# Patient Record
Sex: Female | Born: 1960 | Race: White | Hispanic: No | Marital: Single | Smoking: Never smoker
Health system: Southern US, Community
[De-identification: ages and names within clinical notes are randomized; demographics above are authoritative.]

## PROBLEM LIST (undated history)

## (undated) DIAGNOSIS — C50919 Malignant neoplasm of unspecified site of unspecified female breast: Secondary | ICD-10-CM

## (undated) DIAGNOSIS — F32A Depression, unspecified: Secondary | ICD-10-CM

## (undated) DIAGNOSIS — E049 Nontoxic goiter, unspecified: Secondary | ICD-10-CM

## (undated) DIAGNOSIS — F419 Anxiety disorder, unspecified: Secondary | ICD-10-CM

## (undated) DIAGNOSIS — M810 Age-related osteoporosis without current pathological fracture: Secondary | ICD-10-CM

## (undated) DIAGNOSIS — F329 Major depressive disorder, single episode, unspecified: Secondary | ICD-10-CM

## (undated) DIAGNOSIS — M1990 Unspecified osteoarthritis, unspecified site: Secondary | ICD-10-CM

## (undated) DIAGNOSIS — D219 Benign neoplasm of connective and other soft tissue, unspecified: Secondary | ICD-10-CM

## (undated) HISTORY — PX: PORTACATH PLACEMENT: SHX2246

## (undated) HISTORY — PX: WISDOM TOOTH EXTRACTION: SHX21

## (undated) HISTORY — PX: BREAST SURGERY: SHX581

## (undated) HISTORY — PX: PORT-A-CATH REMOVAL: SHX5289

---

## 2015-09-27 DIAGNOSIS — C50919 Malignant neoplasm of unspecified site of unspecified female breast: Secondary | ICD-10-CM

## 2015-09-27 HISTORY — DX: Malignant neoplasm of unspecified site of unspecified female breast: C50.919

## 2015-09-27 HISTORY — PX: MASTECTOMY: SHX3

## 2017-10-27 HISTORY — PX: COLONOSCOPY: SHX174

## 2018-02-06 ENCOUNTER — Encounter (HOSPITAL_COMMUNITY): Payer: Self-pay

## 2018-02-06 NOTE — Patient Instructions (Addendum)
Your procedure is scheduled on: Tuesday, Feb 13, 2018   Surgery Time:  2:20PM-3:30PM   Report to [facility] Main  Entrance    Report to admitting at 11:50 AM   Call this number if you have problems the morning of surgery [phone]   Do not eat food:After Midnight.   Do NOT smoke after Midnight   May have liquids until 8:00 AM morning of surgery      CLEAR LIQUID DIET   Foods Allowed                                                                     Foods Excluded  Coffee and tea, regular and decaf                             liquids that you cannot  Plain Jell-O in any flavor                                             see through such as: Fruit ices (not with fruit pulp)                                     milk, soups, orange juice  Iced Popsicles                                    All solid food Carbonated beverages, regular and diet                                    Cranberry, grape and apple juices Sports drinks like Gatorade Lightly seasoned clear broth or consume(fat free) Sugar, honey syrup  Sample Menu Breakfast                                Lunch                                     Supper Cranberry juice                    Beef broth                            Chicken broth Jell-O                                     Grape juice                           Apple juice Coffee or tea  Jell-O                                      Popsicle                                                Coffee or tea                        Coffee or tea  _____________________________________________________________________     CLEAR LIQUID DIET   Foods Allowed                                                                     Foods Excluded  Coffee and tea, regular and decaf                             liquids that you cannot  Plain Jell-O in any flavor                                             see through such as: Fruit ices (not  with fruit pulp)                                     milk, soups, orange juice  Iced Popsicles                                    All solid food Carbonated beverages, regular and diet                                    Cranberry, grape and apple juices Sports drinks like Gatorade Lightly seasoned clear broth or consume(fat free) Sugar, honey syrup  Sample Menu Breakfast                                Lunch                                     Supper Cranberry juice                    Beef broth                            Chicken broth Jell-O  Grape juice                           Apple juice Coffee or tea                        Jell-O                                      Popsicle                                                Coffee or tea                        Coffee or tea   Take these medicines the morning of surgery with A SIP OF WATER: None                               You may not have any metal on your body including hair pins, jewelry, and body piercings             Do not wear make-up, lotions, powders, perfumes/cologne, or deodorant             Do not wear nail polish.  Do not shave  48 hours prior to surgery.               Do not bring valuables to the hospital. Jessica Mccann.   Contacts, dentures or bridgework may not be worn into surgery.   Leave suitcase in the car. After surgery it may be brought to your room.   Special Instructions: Bring a copy of your healthcare power of attorney and living will documents         the day of surgery if you haven't scanned them in before.              Please read over the following fact sheets you were given:  [facility] - Preparing for Surgery Before surgery, you can play an important role.  Because skin is not sterile, your skin needs to be as free of germs as possible.  You can reduce the number of germs on your skin by washing with CHG (chlorahexidine  gluconate) soap before surgery.  CHG is an antiseptic cleaner which kills germs and bonds with the skin to continue killing germs even after washing. Please DO NOT use if you have an allergy to CHG or antibacterial soaps.  If your skin becomes reddened/irritated stop using the CHG and inform your nurse when you arrive at Short Stay. Do not shave (including legs and underarms) for at least 48 hours prior to the first CHG shower.  You may shave your face/neck.  Please follow these instructions carefully:  1.  Shower with CHG Soap the night before surgery and the  morning of surgery.  2.  If you choose to wash your hair, wash your hair first as usual with your normal  shampoo.  3.  After you shampoo, rinse your hair and body thoroughly to remove the  shampoo.                             4.  Use CHG as you would any other liquid soap.  You can apply chg directly to the skin and wash.  Gently with a scrungie or clean washcloth.  5.  Apply the CHG Soap to your body ONLY FROM THE NECK DOWN.   Do   not use on face/ open                           Wound or open sores. Avoid contact with eyes, ears mouth and   genitals (private parts).                       Wash face,  Genitals (private parts) with your normal soap.             6.  Wash thoroughly, paying special attention to the area where your    surgery  will be performed.  7.  Thoroughly rinse your body with warm water from the neck down.  8.  DO NOT shower/wash with your normal soap after using and rinsing off the CHG Soap.                9.  Pat yourself dry with a clean towel.            10.  Wear clean pajamas.            11.  Place clean sheets on your bed the night of your first shower and do not  sleep with pets. Day of Surgery : Do not apply any lotions/deodorants the morning of surgery.  Please wear clean clothes to the hospital/surgery center.  FAILURE TO FOLLOW THESE INSTRUCTIONS MAY RESULT IN THE CANCELLATION OF YOUR SURGERY  PATIENT  SIGNATURE_________________________________  NURSE SIGNATURE__________________________________  ________________________________________________________________________   Jessica Mccann  An incentive spirometer is a tool that can help keep your lungs clear and active. This tool measures how well you are filling your lungs with each breath. Taking long deep breaths may help reverse or decrease the chance of developing breathing (pulmonary) problems (especially infection) following:  A long period of time when you are unable to move or be active. BEFORE THE PROCEDURE   If the spirometer includes an indicator to show your best effort, your nurse or respiratory therapist will set it to a desired goal.  If possible, sit up straight or lean slightly forward. Try not to slouch.  Hold the incentive spirometer in an upright position. INSTRUCTIONS FOR USE  1. Sit on the edge of your bed if possible, or sit up as far as you can in bed or on a chair. 2. Hold the incentive spirometer in an upright position. 3. Breathe out normally. 4. Place the mouthpiece in your mouth and seal your lips tightly around it. 5. Breathe in slowly and as deeply as possible, raising the piston or the ball toward the top of the column. 6. Hold your breath for 3-5 seconds or for as long as possible. Allow the piston or ball to fall to the bottom of the column. 7. Remove the mouthpiece from your mouth and breathe out normally. 8. Rest for a few seconds and repeat Steps 1 through 7 at least 10 times every 1-2 hours when you are awake. Take your time and take a few  normal breaths between deep breaths. 9. The spirometer may include an indicator to show your best effort. Use the indicator as a goal to work toward during each repetition. 10. After each set of 10 deep breaths, practice coughing to be sure your lungs are clear. If you have an incision (the cut made at the time of surgery), support your incision when coughing  by placing a pillow or rolled up towels firmly against it. Once you are able to get out of bed, walk around indoors and cough well. You may stop using the incentive spirometer when instructed by your caregiver.  RISKS AND COMPLICATIONS  Take your time so you do not get dizzy or light-headed.  If you are in pain, you may need to take or ask for pain medication before doing incentive spirometry. It is harder to take a deep breath if you are having pain. AFTER USE  Rest and breathe slowly and easily.  It can be helpful to keep track of a log of your progress. Your caregiver can provide you with a simple table to help with this. If you are using the spirometer at home, follow these instructions: [place] IF:   You are having difficultly using the spirometer.  You have trouble using the spirometer as often as instructed.  Your pain medication is not giving enough relief while using the spirometer.  You develop fever of 100.5 F (38.1 C) or higher. SEEK IMMEDIATE MEDICAL CARE IF:   You cough up bloody sputum that had not been present before.  You develop fever of 102 F (38.9 C) or greater.  You develop worsening pain at or near the incision site. MAKE SURE YOU:   Understand these instructions.  Will watch your condition.  Will get help right away if you are not doing well or get worse. Document Released: 01/23/2007 Document Revised: 12/05/2011 Document Reviewed: 03/26/2007 Exit[employer] Patient Information 2014 Exit[employer], [place].   ________________________________________________________________________  WHAT IS A BLOOD TRANSFUSION? Blood Transfusion Information  A transfusion is the replacement of blood or some of its parts. Blood is made up of multiple cells which provide different functions.  Red blood cells carry oxygen and are used for blood loss replacement.  White blood cells fight against infection.  Platelets control bleeding.  Plasma helps clot  blood.  Other blood products are available for specialized needs, such as hemophilia or other clotting disorders. BEFORE THE TRANSFUSION  Who gives blood for transfusions?   Healthy volunteers who are fully evaluated to make sure their blood is safe. This is blood bank blood. Transfusion therapy is the safest it has ever been in the practice of medicine. Before blood is taken from a donor, a complete history is taken to make sure that person has no history of diseases nor engages in risky social behavior (examples are intravenous drug use or sexual activity with multiple partners). The donor's travel history is screened to minimize risk of transmitting infections, such as malaria. The donated blood is tested for signs of infectious diseases, such as HIV and hepatitis. The blood is then tested to be sure it is compatible with you in order to minimize the chance of a transfusion reaction. If you or a relative donates blood, this is often done in anticipation of surgery and is not appropriate for emergency situations. It takes many days to process the donated blood. RISKS AND COMPLICATIONS Although transfusion therapy is very safe and saves many lives, the main dangers of transfusion include:   Getting  an infectious disease.  Developing a transfusion reaction. This is an allergic reaction to something in the blood you were given. Every precaution is taken to prevent this. The decision to have a blood transfusion has been considered carefully by your caregiver before blood is given. Blood is not given unless the benefits outweigh the risks. AFTER THE TRANSFUSION  Right after receiving a blood transfusion, you will usually feel much better and more energetic. This is especially true if your red blood cells have gotten low (anemic). The transfusion raises the level of the red blood cells which carry oxygen, and this usually causes an energy increase.  The nurse administering the transfusion will monitor  you carefully for complications. HOME CARE INSTRUCTIONS  No special instructions are needed after a transfusion. You may find your energy is better. Speak with your caregiver about any limitations on activity for underlying diseases you may have. SEEK MEDICAL CARE IF:   Your condition is not improving after your transfusion.  You develop redness or irritation at the intravenous (IV) site. SEEK IMMEDIATE MEDICAL CARE IF:  Any of the following symptoms occur over the next 12 hours:  Shaking chills.  You have a temperature by mouth above 102 F (38.9 C), not controlled by medicine.  Chest, back, or muscle pain.  People around you feel you are not acting correctly or are confused.  Shortness of breath or difficulty breathing.  Dizziness and fainting.  You get a rash or develop hives.  You have a decrease in urine output.  Your urine turns a dark color or changes to pink, red, or brown. Any of the following symptoms occur over the next 10 days:  You have a temperature by mouth above 102 F (38.9 C), not controlled by medicine.  Shortness of breath.  Weakness after normal activity.  The white part of the eye turns yellow (jaundice).  You have a decrease in the amount of urine or are urinating less often.  Your urine turns a dark color or changes to pink, red, or brown. Document Released: 09/09/2000 Document Revised: 12/05/2011 Document Reviewed: 04/28/2008 [facility] Patient Information 2014 [place], [place].  _______________________________________________________________________

## 2018-02-07 ENCOUNTER — Encounter (HOSPITAL_COMMUNITY): Payer: Self-pay

## 2018-02-07 ENCOUNTER — Encounter (HOSPITAL_COMMUNITY)
Admission: RE | Admit: 2018-02-07 | Discharge: 2018-02-07 | Disposition: A | Discharge status: Home / Self Care | Payer: BC Managed Care – PPO | Source: Ambulatory Visit | Attending: Orthopedic Surgery | Admitting: Orthopedic Surgery

## 2018-02-07 ENCOUNTER — Other Ambulatory Visit: Payer: Self-pay

## 2018-02-07 DIAGNOSIS — Z01812 Encounter for preprocedural laboratory examination: Secondary | ICD-10-CM | POA: Insufficient documentation

## 2018-02-07 HISTORY — DX: Nontoxic goiter, unspecified: E04.9

## 2018-02-07 HISTORY — DX: Unspecified osteoarthritis, unspecified site: M19.90

## 2018-02-07 HISTORY — DX: Benign neoplasm of connective and other soft tissue, unspecified: D21.9

## 2018-02-07 HISTORY — DX: Age-related osteoporosis without current pathological fracture: M81.0

## 2018-02-07 HISTORY — DX: Major depressive disorder, single episode, unspecified: F32.9

## 2018-02-07 HISTORY — DX: Anxiety disorder, unspecified: F41.9

## 2018-02-07 HISTORY — DX: Malignant neoplasm of unspecified site of unspecified female breast: C50.919

## 2018-02-07 HISTORY — DX: Depression, unspecified: F32.A

## 2018-02-07 LAB — CBC
HEMATOCRIT: 40.1 % (ref 36.0–46.0)
Hemoglobin: 13.2 g/dL (ref 12.0–15.0)
MCH: 30.8 pg (ref 26.0–34.0)
MCHC: 32.9 g/dL (ref 30.0–36.0)
MCV: 93.5 fL (ref 78.0–100.0)
Platelets: 304 10*3/uL (ref 150–400)
RBC: 4.29 MIL/uL (ref 3.87–5.11)
RDW: 12.5 % (ref 11.5–15.5)
WBC: 5.1 10*3/uL (ref 4.0–10.5)

## 2018-02-07 LAB — SURGICAL PCR SCREEN
MRSA, PCR: NEGATIVE
Staphylococcus aureus: NEGATIVE

## 2018-02-08 LAB — ABO/RH: ABO/RH(D): A POS

## 2018-02-11 NOTE — H&P (Signed)
TOTAL HIP ADMISSION H&P  Patient is admitted for right total hip arthroplasty, anterior approach.  Subjective:  Chief Complaint: Right hip primary OA / pain  HPI: Jessica Mccann, 57 y.o. female, has a history of pain and functional disability in the right hip(s) due to arthritis and patient has failed non-surgical conservative treatments for greater than 12 weeks to include NSAID's and/or analgesics and activity modification.  Onset of symptoms was gradual starting 5+ years ago with gradually worsening course since that time.The patient noted no past surgery on the right hip(s).  Patient currently rates pain in the right hip at 4 out of 10 with activity. Patient has night pain, worsening of pain with activity and weight bearing, trendelenberg gait, pain that interfers with activities of daily living and pain with passive range of motion. Patient has evidence of periarticular osteophytes and joint space narrowing by imaging studies. This condition presents safety issues increasing the risk of falls.  There is no current active infection.  Risks, benefits and expectations were discussed with the patient.  Risks including but not limited to the risk of anesthesia, blood clots, nerve damage, blood vessel damage, failure of the prosthesis, infection and up to and including death.  Patient understand the risks, benefits and expectations and wishes to proceed with surgery.   PCP: [facility] [place]  D/C Plans:       Home   Post-op Meds:       No Rx given   Tranexamic Acid:      To be given - IV   Decadron:      Is to be given  FYI:      ASA  Norco  DME:    Rx given for - RW   PT:  No PT     Past Medical History:  Diagnosis Date  . Anxiety   . Arthritis    hip and toe  . Breast cancer ([place]) 2017   Right  . Depression   . Fibroids   . Goiter   . Osteoporosis     Past Surgical History:  Procedure Laterality Date  . BREAST SURGERY    . COLONOSCOPY  10/2017  .  MASTECTOMY Right 2017  . PORT-A-CATH REMOVAL    . PORTACATH PLACEMENT    . WISDOM TOOTH EXTRACTION      No current facility-administered medications for this encounter.    Current Outpatient Medications  Medication Sig Dispense Refill Last Dose  . Calcium Carb-Cholecalciferol (CALCIUM 500+D3 PO) Take 1 tablet by mouth 2 (two) times daily.     . cholecalciferol (VITAMIN D) 1000 units tablet Take 1,000 Units by mouth daily.     Marland Kitchen denosumab (PROLIA) 60 MG/ML SOSY injection Inject 60 mg into the skin every 6 (six) months.     . DULoxetine (CYMBALTA) 30 MG capsule Take 1 capsule by mouth daily.  5   . letrozole (FEMARA) 2.5 MG tablet Take 1 tablet by mouth daily.  0   . Multiple Vitamins-Minerals (MULTIVITAMIN WITH MINERALS) tablet Take 1 tablet by mouth daily.      No Known Allergies   Social History   Tobacco Use  . Smoking status: Never Smoker  . Smokeless tobacco: Never Used  Substance Use Topics  . Alcohol use: Not Currently       Review of Systems  Constitutional: Negative.   HENT: Negative.   Eyes: Negative.   Respiratory: Negative.   Cardiovascular: Negative.   Gastrointestinal: Negative.   Genitourinary: Negative.  Musculoskeletal: Positive for joint pain.  Skin: Negative.   Neurological: Negative.   Endo/Heme/Allergies: Negative.   Psychiatric/Behavioral: Positive for depression. The patient is nervous/anxious.     Objective:  Physical Exam  Constitutional: She is oriented to person, place, and time. She appears well-developed.  HENT:  Head: Normocephalic.  Eyes: Pupils are equal, round, and reactive to light.  Neck: Neck supple. No JVD present. No tracheal deviation present. No thyromegaly present.  Cardiovascular: Normal rate, regular rhythm and intact distal pulses.  Respiratory: Effort normal and breath sounds normal. No respiratory distress. She has no wheezes.  GI: Soft. There is no tenderness. There is no guarding.  Musculoskeletal:       Right hip:  She exhibits decreased range of motion, decreased strength, tenderness and bony tenderness. She exhibits no swelling, no deformity and no laceration.  Lymphadenopathy:    She has no cervical adenopathy.  Neurological: She is alert and oriented to person, place, and time.  Skin: Skin is warm and dry.  Psychiatric: She has a normal mood and affect.      Labs:  Estimated body mass index is 21.73 kg/m as calculated from the following:   Height as of 02/07/18: 5' 8.5" (1.74 m).   Weight as of 02/07/18: 65.8 kg (145 lb).   Imaging Review Plain radiographs demonstrate severe degenerative joint disease of the right hip(s). The bone quality appears to be good for age and reported activity level.    Preoperative templating of the joint replacement has been completed, documented, and submitted to the Operating Room personnel in order to optimize intra-operative equipment management.     Assessment/Plan:  End stage arthritis, right hip(s)  The patient history, physical examination, clinical judgement of the provider and imaging studies are consistent with end stage degenerative joint disease of the right hip(s) and total hip arthroplasty is deemed medically necessary. The treatment options including medical management, injection therapy, arthroscopy and arthroplasty were discussed at length. The risks and benefits of total hip arthroplasty were presented and reviewed. The risks due to aseptic loosening, infection, stiffness, dislocation/subluxation,  thromboembolic complications and other imponderables were discussed.  The patient acknowledged the explanation, agreed to proceed with the plan and consent was signed. Patient is being admitted for inpatient treatment for surgery, pain control, PT, OT, prophylactic antibiotics, VTE prophylaxis, progressive ambulation and ADL's and discharge planning.The patient is planning to be discharged home.     Jessica Mccann Paxson Harrower   PA-C  02/11/2018, 1:21 PM

## 2018-02-12 MED ORDER — TRANEXAMIC ACID 1000 MG/10ML IV SOLN
1000.0000 mg | INTRAVENOUS | Status: AC
  Administered 2018-02-13: 1000 mg via INTRAVENOUS
  Filled 2018-02-12: qty 1100

## 2018-02-13 ENCOUNTER — Inpatient Hospital Stay (HOSPITAL_COMMUNITY): Payer: BC Managed Care – PPO

## 2018-02-13 ENCOUNTER — Inpatient Hospital Stay (HOSPITAL_COMMUNITY)
Admission: RE | Admit: 2018-02-13 | Discharge: 2018-02-14 | DRG: 470 | Disposition: A | Discharge status: Home / Self Care | Payer: BC Managed Care – PPO | Source: Ambulatory Visit | Attending: Orthopedic Surgery | Admitting: Orthopedic Surgery

## 2018-02-13 ENCOUNTER — Inpatient Hospital Stay (HOSPITAL_COMMUNITY): Payer: BC Managed Care – PPO | Admitting: Certified Registered Nurse Anesthetist

## 2018-02-13 ENCOUNTER — Encounter (HOSPITAL_COMMUNITY)
Admission: RE | Disposition: A | Discharge status: Home / Self Care | Payer: Self-pay | Source: Ambulatory Visit | Attending: Orthopedic Surgery

## 2018-02-13 ENCOUNTER — Encounter (HOSPITAL_COMMUNITY): Payer: Self-pay | Admitting: Certified Registered Nurse Anesthetist

## 2018-02-13 DIAGNOSIS — M1631 Unilateral osteoarthritis resulting from hip dysplasia, right hip: Principal | ICD-10-CM | POA: Diagnosis present

## 2018-02-13 DIAGNOSIS — F329 Major depressive disorder, single episode, unspecified: Secondary | ICD-10-CM | POA: Diagnosis present

## 2018-02-13 DIAGNOSIS — Z96641 Presence of right artificial hip joint: Secondary | ICD-10-CM

## 2018-02-13 DIAGNOSIS — Z7982 Long term (current) use of aspirin: Secondary | ICD-10-CM | POA: Diagnosis not present

## 2018-02-13 DIAGNOSIS — Z853 Personal history of malignant neoplasm of breast: Secondary | ICD-10-CM

## 2018-02-13 DIAGNOSIS — Z96649 Presence of unspecified artificial hip joint: Secondary | ICD-10-CM

## 2018-02-13 DIAGNOSIS — M810 Age-related osteoporosis without current pathological fracture: Secondary | ICD-10-CM | POA: Diagnosis present

## 2018-02-13 DIAGNOSIS — M25551 Pain in right hip: Secondary | ICD-10-CM | POA: Diagnosis present

## 2018-02-13 DIAGNOSIS — Z9011 Acquired absence of right breast and nipple: Secondary | ICD-10-CM

## 2018-02-13 DIAGNOSIS — F419 Anxiety disorder, unspecified: Secondary | ICD-10-CM | POA: Diagnosis present

## 2018-02-13 DIAGNOSIS — Z79811 Long term (current) use of aromatase inhibitors: Secondary | ICD-10-CM | POA: Diagnosis not present

## 2018-02-13 HISTORY — PX: TOTAL HIP ARTHROPLASTY: SHX124

## 2018-02-13 LAB — TYPE AND SCREEN
ABO/RH(D): A POS
Antibody Screen: NEGATIVE

## 2018-02-13 SURGERY — ARTHROPLASTY, HIP, TOTAL, ANTERIOR APPROACH
Anesthesia: Spinal | Site: Hip | Laterality: Right

## 2018-02-13 MED ORDER — PROPOFOL 500 MG/50ML IV EMUL
INTRAVENOUS | Status: DC | PRN
  Administered 2018-02-13: 100 ug/kg/min via INTRAVENOUS

## 2018-02-13 MED ORDER — METHOCARBAMOL 1000 MG/10ML IJ SOLN
500.0000 mg | Freq: Four times a day (QID) | INTRAMUSCULAR | Status: DC | PRN
Start: 2018-02-13 — End: 2018-02-14
  Administered 2018-02-13: 500 mg via INTRAVENOUS
  Filled 2018-02-13: qty 550

## 2018-02-13 MED ORDER — METOCLOPRAMIDE HCL 5 MG PO TABS: 5.0000 mg | ORAL_TABLET | Freq: Three times a day (TID) | ORAL | Status: DC | PRN

## 2018-02-13 MED ORDER — ALUM & MAG HYDROXIDE-SIMETH 200-200-20 MG/5ML PO SUSP: 15.0000 mL | ORAL | Status: DC | PRN

## 2018-02-13 MED ORDER — ACETAMINOPHEN 325 MG PO TABS: 325.0000 mg | ORAL_TABLET | Freq: Four times a day (QID) | ORAL | Status: DC | PRN

## 2018-02-13 MED ORDER — STERILE WATER FOR IRRIGATION IR SOLN
Status: DC | PRN
  Administered 2018-02-13: 2000 mL

## 2018-02-13 MED ORDER — FERROUS SULFATE 325 (65 FE) MG PO TABS
325.0000 mg | ORAL_TABLET | Freq: Three times a day (TID) | ORAL | Status: DC
  Administered 2018-02-13 – 2018-02-14 (×3): 325 mg via ORAL
  Filled 2018-02-13 (×3): qty 1

## 2018-02-13 MED ORDER — METHOCARBAMOL 500 MG PO TABS: 500.0000 mg | ORAL_TABLET | Freq: Four times a day (QID) | ORAL | 0 refills | Status: AC | PRN

## 2018-02-13 MED ORDER — ONDANSETRON HCL 4 MG/2ML IJ SOLN
INTRAMUSCULAR | Status: DC | PRN
  Administered 2018-02-13: 4 mg via INTRAVENOUS

## 2018-02-13 MED ORDER — FENTANYL CITRATE (PF) 100 MCG/2ML IJ SOLN
INTRAMUSCULAR | Status: DC | PRN
  Administered 2018-02-13: 100 ug via INTRAVENOUS

## 2018-02-13 MED ORDER — PROPOFOL 10 MG/ML IV BOLUS
INTRAVENOUS | Status: AC
  Filled 2018-02-13: qty 20

## 2018-02-13 MED ORDER — DOCUSATE SODIUM 100 MG PO CAPS
100.0000 mg | ORAL_CAPSULE | Freq: Two times a day (BID) | ORAL | Status: DC
  Administered 2018-02-13 – 2018-02-14 (×2): 100 mg via ORAL
  Filled 2018-02-13 (×2): qty 1

## 2018-02-13 MED ORDER — ASPIRIN 81 MG PO CHEW
81.0000 mg | CHEWABLE_TABLET | Freq: Two times a day (BID) | ORAL | Status: DC
  Administered 2018-02-13 – 2018-02-14 (×2): 81 mg via ORAL
  Filled 2018-02-13 (×2): qty 1

## 2018-02-13 MED ORDER — FENTANYL CITRATE (PF) 100 MCG/2ML IJ SOLN
INTRAMUSCULAR | Status: AC
  Filled 2018-02-13: qty 2

## 2018-02-13 MED ORDER — GLYCOPYRROLATE 0.2 MG/ML IV SOSY
PREFILLED_SYRINGE | INTRAVENOUS | Status: AC
  Filled 2018-02-13: qty 5

## 2018-02-13 MED ORDER — DEXAMETHASONE SODIUM PHOSPHATE 10 MG/ML IJ SOLN
10.0000 mg | Freq: Once | INTRAMUSCULAR | Status: AC
  Administered 2018-02-14: 10 mg via INTRAVENOUS
  Filled 2018-02-13: qty 1

## 2018-02-13 MED ORDER — ONDANSETRON HCL 4 MG PO TABS: 4.0000 mg | ORAL_TABLET | Freq: Four times a day (QID) | ORAL | Status: DC | PRN

## 2018-02-13 MED ORDER — TRANEXAMIC ACID 1000 MG/10ML IV SOLN
1000.0000 mg | Freq: Once | INTRAVENOUS | Status: AC
  Administered 2018-02-13: 1000 mg via INTRAVENOUS
  Filled 2018-02-13: qty 10

## 2018-02-13 MED ORDER — ONDANSETRON HCL 4 MG/2ML IJ SOLN: 4.0000 mg | Freq: Once | INTRAMUSCULAR | Status: DC | PRN

## 2018-02-13 MED ORDER — LIDOCAINE HCL (CARDIAC) PF 100 MG/5ML IV SOSY
PREFILLED_SYRINGE | INTRAVENOUS | Status: DC | PRN
  Administered 2018-02-13: 60 mg via INTRAVENOUS

## 2018-02-13 MED ORDER — DOCUSATE SODIUM 100 MG PO CAPS: 100.0000 mg | ORAL_CAPSULE | Freq: Two times a day (BID) | ORAL | 0 refills | Status: AC

## 2018-02-13 MED ORDER — MENTHOL 3 MG MT LOZG: 1.0000 | LOZENGE | OROMUCOSAL | Status: DC | PRN

## 2018-02-13 MED ORDER — DEXAMETHASONE SODIUM PHOSPHATE 10 MG/ML IJ SOLN
10.0000 mg | Freq: Once | INTRAMUSCULAR | Status: AC
  Administered 2018-02-13: 10 mg via INTRAVENOUS

## 2018-02-13 MED ORDER — DULOXETINE HCL 30 MG PO CPEP
30.0000 mg | ORAL_CAPSULE | Freq: Every day | ORAL | Status: DC
  Administered 2018-02-13: 30 mg via ORAL
  Filled 2018-02-13 (×2): qty 1

## 2018-02-13 MED ORDER — DEXAMETHASONE SODIUM PHOSPHATE 10 MG/ML IJ SOLN
INTRAMUSCULAR | Status: AC
  Filled 2018-02-13: qty 1

## 2018-02-13 MED ORDER — LACTATED RINGERS IV SOLN
INTRAVENOUS | Status: DC
  Administered 2018-02-13 (×2): via INTRAVENOUS

## 2018-02-13 MED ORDER — MAGNESIUM CITRATE PO SOLN: 1.0000 | Freq: Once | ORAL | Status: DC | PRN

## 2018-02-13 MED ORDER — PHENYLEPHRINE 40 MCG/ML (10ML) SYRINGE FOR IV PUSH (FOR BLOOD PRESSURE SUPPORT)
PREFILLED_SYRINGE | INTRAVENOUS | Status: DC | PRN
  Administered 2018-02-13 (×9): 80 ug via INTRAVENOUS

## 2018-02-13 MED ORDER — FERROUS SULFATE 325 (65 FE) MG PO TABS: 325.0000 mg | ORAL_TABLET | Freq: Three times a day (TID) | ORAL | 3 refills | Status: AC

## 2018-02-13 MED ORDER — SODIUM CHLORIDE 0.9 % IR SOLN
Status: DC | PRN
  Administered 2018-02-13: 1000 mL

## 2018-02-13 MED ORDER — PROPOFOL 10 MG/ML IV BOLUS
INTRAVENOUS | Status: DC | PRN
  Administered 2018-02-13 (×2): 20 mg via INTRAVENOUS

## 2018-02-13 MED ORDER — 0.9 % SODIUM CHLORIDE (POUR BTL) OPTIME
TOPICAL | Status: DC | PRN
  Administered 2018-02-13: 1000 mL

## 2018-02-13 MED ORDER — EPHEDRINE SULFATE-NACL 50-0.9 MG/10ML-% IV SOSY
PREFILLED_SYRINGE | INTRAVENOUS | Status: DC | PRN
  Administered 2018-02-13 (×2): 10 mg via INTRAVENOUS

## 2018-02-13 MED ORDER — POLYETHYLENE GLYCOL 3350 17 G PO PACK: 17.0000 g | PACK | Freq: Two times a day (BID) | ORAL | 0 refills | Status: AC

## 2018-02-13 MED ORDER — HYDROCODONE-ACETAMINOPHEN 7.5-325 MG PO TABS: 1.0000 | ORAL_TABLET | ORAL | 0 refills | Status: AC | PRN

## 2018-02-13 MED ORDER — LIDOCAINE 2% (20 MG/ML) 5 ML SYRINGE
INTRAMUSCULAR | Status: AC
  Filled 2018-02-13: qty 5

## 2018-02-13 MED ORDER — CELECOXIB 200 MG PO CAPS
200.0000 mg | ORAL_CAPSULE | Freq: Two times a day (BID) | ORAL | Status: DC
  Administered 2018-02-13 – 2018-02-14 (×2): 200 mg via ORAL
  Filled 2018-02-13 (×2): qty 1

## 2018-02-13 MED ORDER — HYDROMORPHONE HCL 1 MG/ML IJ SOLN: 0.5000 mg | INTRAMUSCULAR | Status: DC | PRN

## 2018-02-13 MED ORDER — HYDROCODONE-ACETAMINOPHEN 5-325 MG PO TABS
1.0000 | ORAL_TABLET | ORAL | Status: DC | PRN
  Administered 2018-02-13 – 2018-02-14 (×3): 1 via ORAL
  Filled 2018-02-13: qty 1
  Filled 2018-02-13: qty 2
  Filled 2018-02-13: qty 1

## 2018-02-13 MED ORDER — LETROZOLE 2.5 MG PO TABS
2.5000 mg | ORAL_TABLET | Freq: Every day | ORAL | Status: DC
  Administered 2018-02-13: 2.5 mg via ORAL
  Filled 2018-02-13 (×2): qty 1

## 2018-02-13 MED ORDER — BISACODYL 10 MG RE SUPP: 10.0000 mg | Freq: Every day | RECTAL | Status: DC | PRN

## 2018-02-13 MED ORDER — ASPIRIN 81 MG PO CHEW: 81.0000 mg | CHEWABLE_TABLET | Freq: Two times a day (BID) | ORAL | 0 refills | Status: AC

## 2018-02-13 MED ORDER — BUPIVACAINE HCL (PF) 0.5 % IJ SOLN
INTRAMUSCULAR | Status: AC
  Filled 2018-02-13: qty 30

## 2018-02-13 MED ORDER — ONDANSETRON HCL 4 MG/2ML IJ SOLN
INTRAMUSCULAR | Status: AC
  Filled 2018-02-13: qty 2

## 2018-02-13 MED ORDER — PROPOFOL 10 MG/ML IV BOLUS
INTRAVENOUS | Status: AC
  Filled 2018-02-13: qty 40

## 2018-02-13 MED ORDER — SODIUM CHLORIDE 0.9 % IV SOLN
INTRAVENOUS | Status: DC
  Administered 2018-02-13 (×2): via INTRAVENOUS

## 2018-02-13 MED ORDER — PHENOL 1.4 % MT LIQD: 1.0000 | OROMUCOSAL | Status: DC | PRN

## 2018-02-13 MED ORDER — MEPERIDINE HCL 50 MG/ML IJ SOLN: 6.2500 mg | INTRAMUSCULAR | Status: DC | PRN

## 2018-02-13 MED ORDER — DIPHENHYDRAMINE HCL 12.5 MG/5ML PO ELIX: 12.5000 mg | ORAL_SOLUTION | ORAL | Status: DC | PRN

## 2018-02-13 MED ORDER — ONDANSETRON HCL 4 MG/2ML IJ SOLN: 4.0000 mg | Freq: Four times a day (QID) | INTRAMUSCULAR | Status: DC | PRN

## 2018-02-13 MED ORDER — METOCLOPRAMIDE HCL 5 MG/ML IJ SOLN: 5.0000 mg | Freq: Three times a day (TID) | INTRAMUSCULAR | Status: DC | PRN

## 2018-02-13 MED ORDER — HYDROMORPHONE HCL 1 MG/ML IJ SOLN: 0.2500 mg | INTRAMUSCULAR | Status: DC | PRN

## 2018-02-13 MED ORDER — POLYETHYLENE GLYCOL 3350 17 G PO PACK
17.0000 g | PACK | Freq: Two times a day (BID) | ORAL | Status: DC
  Administered 2018-02-14: 17 g via ORAL
  Filled 2018-02-13: qty 1

## 2018-02-13 MED ORDER — MIDAZOLAM HCL 5 MG/5ML IJ SOLN
INTRAMUSCULAR | Status: DC | PRN
  Administered 2018-02-13: 2 mg via INTRAVENOUS

## 2018-02-13 MED ORDER — CEFAZOLIN SODIUM-DEXTROSE 2-4 GM/100ML-% IV SOLN
2.0000 g | Freq: Four times a day (QID) | INTRAVENOUS | Status: AC
  Administered 2018-02-13 – 2018-02-14 (×2): 2 g via INTRAVENOUS
  Filled 2018-02-13 (×2): qty 100

## 2018-02-13 MED ORDER — CHLORHEXIDINE GLUCONATE 4 % EX LIQD: 60.0000 mL | Freq: Once | CUTANEOUS | Status: DC

## 2018-02-13 MED ORDER — HYDROCODONE-ACETAMINOPHEN 7.5-325 MG PO TABS
1.0000 | ORAL_TABLET | ORAL | Status: DC | PRN
  Filled 2018-02-13: qty 1

## 2018-02-13 MED ORDER — METHOCARBAMOL 500 MG PO TABS: 500.0000 mg | ORAL_TABLET | Freq: Four times a day (QID) | ORAL | Status: DC | PRN

## 2018-02-13 MED ORDER — MIDAZOLAM HCL 2 MG/2ML IJ SOLN
INTRAMUSCULAR | Status: AC
  Filled 2018-02-13: qty 2

## 2018-02-13 MED ORDER — CEFAZOLIN SODIUM-DEXTROSE 2-4 GM/100ML-% IV SOLN
2.0000 g | INTRAVENOUS | Status: AC
  Administered 2018-02-13: 2 g via INTRAVENOUS
  Filled 2018-02-13: qty 100

## 2018-02-13 SURGICAL SUPPLY — 35 items
BAG DECANTER FOR FLEXI CONT (MISCELLANEOUS) IMPLANT
BAG ZIPLOCK 12X15 (MISCELLANEOUS) IMPLANT
BLADE SAG 18X100X1.27 (BLADE) ×3 IMPLANT
CAPT HIP TOTAL 2 ×3 IMPLANT
COVER PERINEAL POST (MISCELLANEOUS) ×3 IMPLANT
COVER SURGICAL LIGHT HANDLE (MISCELLANEOUS) ×3 IMPLANT
DERMABOND ADVANCED (GAUZE/BANDAGES/DRESSINGS) ×2
DERMABOND ADVANCED .7 DNX12 (GAUZE/BANDAGES/DRESSINGS) ×1 IMPLANT
DRAPE STERI IOBAN 125X83 (DRAPES) ×3 IMPLANT
DRAPE U-SHAPE 47X51 STRL (DRAPES) ×6 IMPLANT
DRESSING AQUACEL AG SP 3.5X10 (GAUZE/BANDAGES/DRESSINGS) ×1 IMPLANT
DRSG AQUACEL AG ADV 3.5X10 (GAUZE/BANDAGES/DRESSINGS) ×3 IMPLANT
DRSG AQUACEL AG SP 3.5X10 (GAUZE/BANDAGES/DRESSINGS) ×3
DURAPREP 26ML APPLICATOR (WOUND CARE) ×3 IMPLANT
ELECT REM PT RETURN 15FT ADLT (MISCELLANEOUS) ×3 IMPLANT
GLOVE BIOGEL M STRL SZ7.5 (GLOVE) IMPLANT
GLOVE BIOGEL PI IND STRL 7.5 (GLOVE) ×1 IMPLANT
GLOVE BIOGEL PI IND STRL 8.5 (GLOVE) ×1 IMPLANT
GLOVE BIOGEL PI INDICATOR 7.5 (GLOVE) ×2
GLOVE BIOGEL PI INDICATOR 8.5 (GLOVE) ×2
GLOVE ECLIPSE 8.0 STRL XLNG CF (GLOVE) ×6 IMPLANT
GLOVE ORTHO TXT STRL SZ7.5 (GLOVE) ×3 IMPLANT
GOWN STRL REUS W/TWL 2XL LVL3 (GOWN DISPOSABLE) ×3 IMPLANT
GOWN STRL REUS W/TWL LRG LVL3 (GOWN DISPOSABLE) ×3 IMPLANT
HOLDER FOLEY CATH W/STRAP (MISCELLANEOUS) ×3 IMPLANT
PACK ANTERIOR HIP CUSTOM (KITS) ×3 IMPLANT
SUT MNCRL AB 4-0 PS2 18 (SUTURE) ×3 IMPLANT
SUT STRATAFIX 0 PDS 27 VIOLET (SUTURE) ×3
SUT VIC AB 1 CT1 36 (SUTURE) ×9 IMPLANT
SUT VIC AB 2-0 CT1 27 (SUTURE) ×4
SUT VIC AB 2-0 CT1 TAPERPNT 27 (SUTURE) ×2 IMPLANT
SUTURE STRATFX 0 PDS 27 VIOLET (SUTURE) ×1 IMPLANT
TRAY FOLEY CATH 14FR (SET/KITS/TRAYS/PACK) ×3 IMPLANT
WATER STERILE IRR 1000ML POUR (IV SOLUTION) ×6 IMPLANT
YANKAUER SUCT BULB TIP 10FT TU (MISCELLANEOUS) ×3 IMPLANT

## 2018-02-13 NOTE — Interval H&P Note (Signed)
History and Physical Interval Note:  02/13/2018 12:58 PM  Jessica Mccann  has presented today for surgery, with the diagnosis of right hip osteoarthritis  The various methods of treatment have been discussed with the patient and family. After consideration of risks, benefits and other options for treatment, the patient has consented to  Procedure(s): RIGHT TOTAL HIP ARTHROPLASTY ANTERIOR APPROACH (Right) as a surgical intervention .  The patient's history has been reviewed, patient examined, no change in status, stable for surgery.  I have reviewed the patient's chart and labs.  Questions were answered to the patient's satisfaction.     Mauri Pole

## 2018-02-13 NOTE — Transfer of Care (Signed)
Immediate Anesthesia Transfer of Care Note  Patient: Jessica Mccann  Procedure(s) Performed: RIGHT TOTAL HIP ARTHROPLASTY ANTERIOR APPROACH (Right Hip)  Patient Location: PACU  Anesthesia Type:Spinal  Level of Consciousness: awake, alert , oriented and patient cooperative  Airway & Oxygen Therapy: Patient Spontanous Breathing and Patient connected to face mask  Post-op Assessment: Report given to RN, Post -op Vital signs reviewed and stable and Patient moving all extremities  Post vital signs: Reviewed and stable  Last Vitals:  Vitals Value Taken Time  BP 94/41 02/13/2018  3:56 PM  Temp    Pulse 79 02/13/2018  3:56 PM  Resp 21 02/13/2018  3:58 PM  SpO2 100 % 02/13/2018  3:56 PM  Vitals shown include unvalidated device data.  Last Pain:  Vitals:   02/13/18 1322  TempSrc: Oral  PainSc:       Patients Stated Pain Goal: 1 (71/06/26 9485)  Complications: No apparent anesthesia complications

## 2018-02-13 NOTE — Op Note (Signed)
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      SURGEON:  Pietro Cassis. Alvan Dame, M.D.      ASSISTANT:  Danae Orleans, PA-C     ANESTHESIA:  Spinal.      SPECIMENS:  None.      COMPLICATIONS:  None.      BLOOD LOSS:  250 cc     DRAINS:  None.      INDICATION OF THE PROCEDURE:  Jessica Mccann is a 57 y.o. female who had   presented to office for evaluation of right hip pain.  Radiographs revealed   progressive degenerative changes with bone-on-bone   articulation to the  hip joint.  The patient had painful limited range of   motion significantly affecting their overall quality of life.  The patient was failing to    respond to conservative measures, and at this point was ready   to proceed with more definitive measures.  The patient has noted progressive   degenerative changes in his hip, progressive problems and dysfunction   with regarding the hip prior to surgery.  Consent was obtained for   benefit of pain relief.  Specific risk of infection, DVT, component   failure, dislocation, need for revision surgery, as well discussion of   the anterior versus posterior approach were reviewed.  Consent was   obtained for benefit of anterior pain relief through an anterior   approach.      PROCEDURE IN DETAIL:  The patient was brought to operative  theater.   Once adequate anesthesia, preoperative antibiotics, 2 gm of Ancef, 1 gm of Tranexamic Acid, and 10 mg of Decadron administered.   The patient was positioned supine on the OSI Hanna table.  Once adequate   padding of boney process was carried out, we had predraped out the hip, and  used fluoroscopy to confirm orientation of the pelvis and position.      The right hip was then prepped and draped from proximal iliac crest to   mid thigh with shower curtain technique.      Time-out was performed identifying the patient, planned procedure, and   extremity.     An incision was then made 2 cm distal and lateral to the   anterior superior iliac spine extending over the orientation of the   tensor fascia lata muscle and sharp dissection was carried down to the   fascia of the muscle and protractor placed in the  soft tissues.      The fascia was then incised.  The muscle belly was identified and swept   laterally and retractor placed along the superior neck.  Following   cauterization of the circumflex vessels and removing some pericapsular   fat, a second cobra retractor was placed on the inferior neck.  A third   retractor was placed on the anterior acetabulum after elevating the   anterior rectus.  A L-capsulotomy was along the line of the   superior neck to the trochanteric fossa, then extended proximally and   distally.  Tag sutures were placed and the retractors were then placed   intracapsular.  We then identified the trochanteric fossa and   orientation of my neck cut, confirmed this radiographically   and then made a neck osteotomy with the femur on traction.  The femoral   head was removed without difficulty or complication.  Traction was let   off and retractors were placed posterior and anterior around the   acetabulum.      The labrum and foveal tissue were debrided.  I began reaming with a 56mm   reamer and reamed up to 51mm reamer with good bony bed preparation and a  33mm   cup was chosen.  The final 62mm Pinnacle cup was then impacted under fluoroscopy  to confirm the depth of penetration and orientation with respect to   abduction.  A screw was placed followed by the hole eliminator.  The final   36+4 neutral Altrex liner was impacted with good visualized rim fit.  The cup was positioned anatomically within the acetabular portion of the pelvis.      At this point, the femur was rolled at 80 degrees.  Further capsule was   released off the inferior aspect of the femoral neck.  I then   released the superior capsule proximally.  The hook was placed laterally   along the femur and elevated manually and held in position with the bed   hook.  The leg was then extended and adducted with the leg rolled to 100   degrees of external rotation.  Once the proximal femur was fully   exposed, I used a box osteotome to set orientation.  I then began   broaching with the starting chili pepper broach and passed this by hand and then broached up to 2.  With the 2 broach in place I chose a high offset neck and did several trial reductions.  The offset was appropriate, leg lengths   appeared to be equal best matched with the +5 head ball confirmed radiographically.   Given these findings, I went ahead and dislocated the hip, repositioned all   retractors and positioned the right hip in the extended and abducted position.  The final 2 Hi Tri Lock stem was   chosen and it was impacted down to the level of neck cut.  Based on this   and the trial reduction, a 36+5 delta ceramic ball was chosen and   impacted onto a clean and dry trunnion, and the hip was reduced.  The   hip had been irrigated throughout the case again at this point.  I did   reapproximate the superior capsular leaflet to the anterior leaflet   using #1 Vicryl.  The fascia of the   tensor fascia lata muscle was then reapproximated using #1 Vicryl and #0 Stratafix sutures.  The   remaining wound was closed with  2-0 Vicryl and running  4-0 Monocryl.   The hip was cleaned, dried, and dressed sterilely using Dermabond and   Aquacel dressing.  She was then brought   to recovery room in stable condition tolerating the procedure well.    Danae Orleans, PA-C was present for the entirety of the case involved from   preoperative positioning, perioperative retractor management, general   facilitation of the case, as well as primary wound closure as assistant.            Pietro Cassis Alvan Dame, M.D.        02/13/2018 2:14 PM

## 2018-02-13 NOTE — Plan of Care (Signed)
Reviewed plan of care with pt and family, specifically safety precautions, pain control, and importance of notifying RN with any questions or concerns. Pt verbalized understanding of all education.

## 2018-02-13 NOTE — Discharge Instructions (Signed)

## 2018-02-13 NOTE — Anesthesia Postprocedure Evaluation (Signed)
Anesthesia Post Note  Patient: Armando Lauman  Procedure(s) Performed: RIGHT TOTAL HIP ARTHROPLASTY ANTERIOR APPROACH (Right Hip)     Patient location during evaluation: PACU Anesthesia Type: Spinal Level of consciousness: oriented and awake and alert Pain management: pain level controlled Vital Signs Assessment: post-procedure vital signs reviewed and stable Respiratory status: spontaneous breathing, respiratory function stable and patient connected to nasal cannula oxygen Cardiovascular status: blood pressure returned to baseline and stable Postop Assessment: no headache, no backache and no apparent nausea or vomiting Anesthetic complications: no    Last Vitals:  Vitals:   02/13/18 1845 02/13/18 1927  BP: (!) 120/57 (!) 109/47  Pulse: 88 65  Resp: (!) 23   Temp:    SpO2: 100% 100%    Last Pain:  Vitals:   02/13/18 1845  TempSrc:   PainSc: 0-No pain                 Jakai Risse DAVID

## 2018-02-13 NOTE — Anesthesia Preprocedure Evaluation (Signed)
Anesthesia Evaluation  Patient identified by MRN, date of birth, ID band Patient awake    Reviewed: Allergy & Precautions, NPO status , Patient's Chart, lab work & pertinent test results  Airway Mallampati: I  TM Distance: >3 FB Neck ROM: Full    Dental   Pulmonary    Pulmonary exam normal        Cardiovascular Normal cardiovascular exam     Neuro/Psych Anxiety Depression    GI/Hepatic   Endo/Other    Renal/GU      Musculoskeletal   Abdominal   Peds  Hematology   Anesthesia Other Findings   Reproductive/Obstetrics                             Anesthesia Physical Anesthesia Plan  ASA: II  Anesthesia Plan: Spinal   Post-op Pain Management:    Induction: Intravenous  PONV Risk Score and Plan: 2 and Ondansetron and Midazolam  Airway Management Planned: Simple Face Mask  Additional Equipment:   Intra-op Plan:   Post-operative Plan:   Informed Consent: I have reviewed the patients History and Physical, chart, labs and discussed the procedure including the risks, benefits and alternatives for the proposed anesthesia with the patient or authorized representative who has indicated his/her understanding and acceptance.     Plan Discussed with: CRNA and Surgeon  Anesthesia Plan Comments:         Anesthesia Quick Evaluation

## 2018-02-14 ENCOUNTER — Other Ambulatory Visit: Payer: Self-pay

## 2018-02-14 ENCOUNTER — Encounter (HOSPITAL_COMMUNITY): Payer: Self-pay | Admitting: Orthopedic Surgery

## 2018-02-14 LAB — BASIC METABOLIC PANEL
ANION GAP: 8 (ref 5–15)
BUN: 11 mg/dL (ref 6–20)
CALCIUM: 8 mg/dL — AB (ref 8.9–10.3)
CO2: 24 mmol/L (ref 22–32)
Chloride: 107 mmol/L (ref 101–111)
Creatinine, Ser: 0.6 mg/dL (ref 0.44–1.00)
GLUCOSE: 195 mg/dL — AB (ref 65–99)
Potassium: 3.9 mmol/L (ref 3.5–5.1)
Sodium: 139 mmol/L (ref 135–145)

## 2018-02-14 LAB — CBC
HCT: 28 % — ABNORMAL LOW (ref 36.0–46.0)
Hemoglobin: 9.4 g/dL — ABNORMAL LOW (ref 12.0–15.0)
MCH: 31.1 pg (ref 26.0–34.0)
MCHC: 33.6 g/dL (ref 30.0–36.0)
MCV: 92.7 fL (ref 78.0–100.0)
PLATELETS: 193 10*3/uL (ref 150–400)
RBC: 3.02 MIL/uL — ABNORMAL LOW (ref 3.87–5.11)
RDW: 12.5 % (ref 11.5–15.5)
WBC: 7.7 10*3/uL (ref 4.0–10.5)

## 2018-02-14 MED ORDER — SODIUM CHLORIDE 0.9 % IV BOLUS
250.0000 mL | Freq: Once | INTRAVENOUS | Status: AC
  Administered 2018-02-14: 250 mL via INTRAVENOUS

## 2018-02-14 NOTE — Progress Notes (Signed)
Physical Therapy Treatment Patient Details Name: Jessica Mccann MRN: [id number] DOB: Oct 15, 1960 Today's Date: 02/14/2018    History of Present Illness Pt s/p R THR and with hx of breast CA with R mastectomy    PT Comments    Pt progressing well with mobility.  Reviewed stairs, car transfers and home therex program with written instruction and progression included.     Follow Up Recommendations  Follow surgeon's recommendation for DC plan and follow-up therapies     Equipment Recommendations  3in1 (PT)    Recommendations for Other Services       Precautions / Restrictions Precautions Precautions: Fall Restrictions Weight Bearing Restrictions: No Other Position/Activity Restrictions: WBAT    Mobility  Bed Mobility Overal bed mobility: Needs Assistance Bed Mobility: Sit to Supine       Sit to supine: Min guard   General bed mobility comments: Pt self assisting R LE with UEs  Transfers Overall transfer level: Needs assistance Equipment used: Rolling walker (2 wheeled) Transfers: Sit to/from Stand Sit to Stand: Min guard;Supervision         General transfer comment: cues for LE management and use of UEs to self assist  Ambulation/Gait Ambulation/Gait assistance: Min guard;Supervision Ambulation Distance (Feet): 150 Feet Assistive device: Rolling walker (2 wheeled) Gait Pattern/deviations: Step-to pattern;Step-through pattern;Decreased step length - right;Decreased step length - left;Shuffle;Antalgic     General Gait Details: cues for posture, position from RW and initial sequence   Stairs Stairs: Yes Stairs assistance: Min assist Stair Management: No rails;Step to pattern;Forwards;With walker Number of Stairs: 4 General stair comments: 2 stairs twice with RW at top, min cues for sequence and foot placement   Wheelchair Mobility    Modified Rankin (Stroke Patients Only)       Balance Overall balance assessment: No apparent balance deficits (not  formally assessed)                                          Cognition Arousal/Alertness: Awake/alert Behavior During Therapy: WFL for tasks assessed/performed Overall Cognitive Status: Within Functional Limits for tasks assessed                                        Exercises Total Joint Exercises Ankle Circles/Pumps: AROM;Both;15 reps;Supine Quad Sets: AROM;Both;10 reps;Supine Short Arc Quad: AROM;Both;15 reps;Supine Heel Slides: AAROM;Right;20 reps;Supine Hip ABduction/ADduction: AAROM;Right;15 reps;Supine    General Comments        Pertinent Vitals/Pain Pain Assessment: 0-10 Pain Score: 2  Pain Location: R hip Pain Descriptors / Indicators: Aching;Sore Pain Intervention(s): Limited activity within patient's tolerance;Monitored during session;Premedicated before session;Ice applied    Home Living                      Prior Function            PT Goals (current goals can now be found in the care plan section) Acute Rehab PT Goals Patient Stated Goal: Regain IND PT Goal Formulation: With patient Time For Goal Achievement: 02/21/18 Potential to Achieve Goals: Good Progress towards PT goals: Progressing toward goals    Frequency    7X/week      PT Plan Current plan remains appropriate    Co-evaluation  AM-PAC PT "6 Clicks" Daily Activity  Outcome Measure  Difficulty turning over in bed (including adjusting bedclothes, sheets and blankets)?: A Lot Difficulty moving from lying on back to sitting on the side of the bed? : A Lot Difficulty sitting down on and standing up from a chair with arms (e.g., wheelchair, bedside commode, etc,.)?: A Lot Help needed moving to and from a bed to chair (including a wheelchair)?: A Little Help needed walking in hospital room?: A Little Help needed climbing 3-5 steps with a railing? : A Little 6 Click Score: 15    End of Session Equipment Utilized During  Treatment: Gait belt Activity Tolerance: Patient tolerated treatment well Patient left: in bed;with call bell/phone within reach;with family/visitor present Nurse Communication: Mobility status PT Visit Diagnosis: Difficulty in walking, not elsewhere classified (R26.2)     Time: 7741-2878 PT Time Calculation (min) (ACUTE ONLY): 34 min  Charges:  $Gait Training: 8-22 mins $Therapeutic Exercise: 8-22 mins                    G Codes:       Pg [phone]    [name] 02/14/2018, 4:39 PM

## 2018-02-14 NOTE — Evaluation (Signed)
Physical Therapy Evaluation Patient Details Name: Jessica Mccann MRN: [id number] DOB: [birth year] Today's Date: 02/14/2018   History of Present Illness  Pt s/p R THR and with hx of breast CA with R mastectomy  Clinical Impression  Pt s/p R THR and presents with decreased R LE strength/ROM and post op pain limiting functional mobility.  Pt should progress to dc home with family assist.   Follow Up Recommendations Follow surgeon's recommendation for DC plan and follow-up therapies    Equipment Recommendations  3in1 (PT)    Recommendations for Other Services       Precautions / Restrictions Precautions Precautions: Fall Restrictions Weight Bearing Restrictions: No Other Position/Activity Restrictions: WBAT      Mobility  Bed Mobility Overal bed mobility: Needs Assistance Bed Mobility: Supine to Sit     Supine to sit: Min assist     General bed mobility comments: cues for sequence and use of L LE to self assist  Transfers Overall transfer level: Needs assistance Equipment used: Rolling walker (2 wheeled) Transfers: Sit to/from Stand Sit to Stand: Min assist         General transfer comment: cues for LE management and use of UEs to self assist  Ambulation/Gait Ambulation/Gait assistance: Min assist;Min guard Ambulation Distance (Feet): 200 Feet Assistive device: Rolling walker (2 wheeled) Gait Pattern/deviations: Step-to pattern;Step-through pattern;Decreased step length - right;Decreased step length - left;Shuffle;Antalgic     General Gait Details: cues for posture, position from RW and initial sequence  Stairs            Wheelchair Mobility    Modified Rankin (Stroke Patients Only)       Balance Overall balance assessment: No apparent balance deficits (not formally assessed)                                           Pertinent Vitals/Pain Pain Assessment: 0-10 Pain Score: 2  Pain Location: R hip Pain Descriptors / Indicators:  Aching;Sore Pain Intervention(s): Limited activity within patient's tolerance;Monitored during session;Ice applied    Home Living Family/patient expects to be discharged to:: Private residence Living Arrangements: Parent Available Help at Discharge: Family Type of Home: House Home Access: Stairs to enter Entrance Stairs-Rails: None Entrance Stairs-Number of Steps: 3 Home Layout: Two level;Able to live on main level with bedroom/bathroom Home Equipment: Walker - standard      Prior Function Level of Independence: Independent               Hand Dominance        Extremity/Trunk Assessment   Upper Extremity Assessment Upper Extremity Assessment: Overall WFL for tasks assessed    Lower Extremity Assessment Lower Extremity Assessment: RLE deficits/detail RLE Deficits / Details: Strength at hip 2+/5 with AAROM at hip to 90 flex and 20 abd    Cervical / Trunk Assessment Cervical / Trunk Assessment: Normal  Communication   Communication: No difficulties  Cognition Arousal/Alertness: Awake/alert Behavior During Therapy: WFL for tasks assessed/performed Overall Cognitive Status: Within Functional Limits for tasks assessed                                        General Comments      Exercises Total Joint Exercises Ankle Circles/Pumps: AROM;Both;15 reps;Supine Quad Sets: AROM;Both;10 reps;Supine Heel Slides: AAROM;Right;20 reps;Supine Hip  ABduction/ADduction: AAROM;Right;15 reps;Supine   Assessment/Plan    PT Assessment Patient needs continued PT services  PT Problem List Decreased strength;Decreased range of motion;Decreased activity tolerance;Decreased mobility;Decreased knowledge of use of DME;Pain       PT Treatment Interventions DME instruction;Gait training;Stair training;Functional mobility training;Therapeutic activities;Therapeutic exercise;Patient/family education    PT Goals (Current goals can be found in the Care Plan section)  Acute  Rehab PT Goals Patient Stated Goal: Regain IND PT Goal Formulation: With patient Time For Goal Achievement: 02/21/18 Potential to Achieve Goals: Good    Frequency 7X/week   Barriers to discharge        Co-evaluation               AM-PAC PT "6 Clicks" Daily Activity  Outcome Measure Difficulty turning over in bed (including adjusting bedclothes, sheets and blankets)?: Unable Difficulty moving from lying on back to sitting on the side of the bed? : Unable Difficulty sitting down on and standing up from a chair with arms (e.g., wheelchair, bedside commode, etc,.)?: A Lot Help needed moving to and from a bed to chair (including a wheelchair)?: A Little Help needed walking in hospital room?: A Little Help needed climbing 3-5 steps with a railing? : A Little 6 Click Score: 13    End of Session Equipment Utilized During Treatment: Gait belt Activity Tolerance: Patient tolerated treatment well Patient left: in chair;with call bell/phone within reach;with family/visitor present Nurse Communication: Mobility status PT Visit Diagnosis: Difficulty in walking, not elsewhere classified (R26.2)    Time: 9024-0973 PT Time Calculation (min) (ACUTE ONLY): 27 min   Charges:   PT Evaluation $PT Eval Low Complexity: 1 Low PT Treatments $Therapeutic Exercise: 8-22 mins   PT G Codes:        Pg [phone]   Javari Bufkin 02/14/2018, 12:49 PM

## 2018-02-14 NOTE — Progress Notes (Signed)
     Subjective: 1 Day Post-Op Procedure(s) (LRB): RIGHT TOTAL HIP ARTHROPLASTY ANTERIOR APPROACH (Right)   Patient reports pain as mild, pain controlled. No events throughout the night.  Ready to work with PT and improve to get her live back.  Ready to be discharged home.      Objective:   VITALS:   Vitals:   02/14/18 0126 02/14/18 0457  BP: (!) 102/44 (!) 96/44  Pulse: 64 67  Resp: 18   Temp: 97.9 F (36.6 C) 98 F (36.7 C)  SpO2: 100% 100%    Dorsiflexion/Plantar flexion intact Incision: dressing C/D/I No cellulitis present Compartment soft  LABS Recent Labs    02/14/18 0549  HGB 9.4*  HCT 28.0*  WBC 7.7  PLT 193    Recent Labs    02/14/18 0549  NA 139  K 3.9  BUN 11  CREATININE 0.60  GLUCOSE 195*     Assessment/Plan: 1 Day Post-Op Procedure(s) (LRB): RIGHT TOTAL HIP ARTHROPLASTY ANTERIOR APPROACH (Right) Foley cath d/c'ed Advance diet Up with therapy D/C IV fluids Discharge home Follow up in 2 weeks at [facility] ([place]). Follow up with OLIN,Mahum Betten D in 2 weeks.  Contact information:  [facility]) [address] [place]. Jessica Mccann   PAC  02/14/2018, 8:55 AM

## 2018-02-14 NOTE — Discharge Summary (Signed)
Physician Discharge Summary  Patient ID: Jessica Mccann MRN: [id number] DOB/AGE: Apr 19, 1961 57 y.o.  Admit date: 02/13/2018 Discharge date:  02/14/2018  Procedures:  Procedure(s) (LRB): RIGHT TOTAL HIP ARTHROPLASTY ANTERIOR APPROACH (Right)  Attending Physician:  Dr. Paralee Cancel   Admission Diagnoses:   Right hip primary OA / pain  Discharge Diagnoses:  Principal Problem:   S/P right THA, AA Active Problems:   S/P hip replacement  Past Medical History:  Diagnosis Date  . Anxiety   . Arthritis    hip and toe  . Breast cancer ([place]) 2017   Right  . Depression   . Fibroids   . Goiter   . Osteoporosis     HPI:    Jessica Mccann, 57 y.o. female, has a history of pain and functional disability in the right hip(s) due to arthritis and patient has failed non-surgical conservative treatments for greater than 12 weeks to include NSAID's and/or analgesics and activity modification.  Onset of symptoms was gradual starting 5+ years ago with gradually worsening course since that time.The patient noted no past surgery on the right hip(s).  Patient currently rates pain in the right hip at 4 out of 10 with activity. Patient has night pain, worsening of pain with activity and weight bearing, trendelenberg gait, pain that interfers with activities of daily living and pain with passive range of motion. Patient has evidence of periarticular osteophytes and joint space narrowing by imaging studies. This condition presents safety issues increasing the risk of falls. There is no current active infection.  Risks, benefits and expectations were discussed with the patient.  Risks including but not limited to the risk of anesthesia, blood clots, nerve damage, blood vessel damage, failure of the prosthesis, infection and up to and including death.  Patient understand the risks, benefits and expectations and wishes to proceed with surgery.   PCP: [facility]   Discharged  Condition: good  Hospital Course:  Patient underwent the above stated procedure on 02/13/2018. Patient tolerated the procedure well and brought to the recovery room in good condition and subsequently to the floor.  POD #1 BP: 96/44 ; Pulse: 67 ; Temp: 98 F (36.7 C) ; Resp: 18 Patient reports pain as mild, pain controlled. No events throughout the night.  Ready to work with PT and improve to get her live back.  Ready to be discharged home.  Dorsiflexion/plantar flexion intact, incision: dressing C/D/I, no cellulitis present and compartment soft.   LABS  Basename    HGB     9.4  HCT     28.0    Discharge Exam: General appearance: alert, cooperative and no distress Extremities: Homans sign is negative, no sign of DVT, no edema, redness or tenderness in the calves or thighs and no ulcers, gangrene or trophic changes  Disposition: Home with follow up in 2 weeks   Follow-up Information    Paralee Cancel, MD. Schedule an appointment as soon as possible for a visit in 2 weeks.   Specialty:  Orthopedic Surgery Contact information: [address] [place] [address] [phone]           Discharge Instructions    Call MD / Call 911   Complete by:  As directed    If you experience chest pain or shortness of breath, CALL 911 and be transported to the hospital emergency room.  If you develope a fever above 101 F, pus (white drainage) or increased drainage or redness at the  wound, or calf pain, call your surgeon's office.   Change dressing   Complete by:  As directed    Maintain surgical dressing until follow up in the clinic. If the edges start to pull up, may reinforce with tape. If the dressing is no longer working, may remove and cover with gauze and tape, but must keep the area dry and clean.  Call with any questions or concerns.   Constipation Prevention   Complete by:  As directed    Drink plenty of fluids.  Prune juice may be helpful.  You may use a stool  softener, such as Colace (over the counter) 100 mg twice a day.  Use MiraLax (over the counter) for constipation as needed.   Diet - low sodium heart healthy   Complete by:  As directed    Discharge instructions   Complete by:  As directed    Maintain surgical dressing until follow up in the clinic. If the edges start to pull up, may reinforce with tape. If the dressing is no longer working, may remove and cover with gauze and tape, but must keep the area dry and clean.  Follow up in 2 weeks at [facility]. Call with any questions or concerns.   Increase activity slowly as tolerated   Complete by:  As directed    Weight bearing as tolerated with assist device (walker, cane, etc) as directed, use it as long as suggested by your surgeon or therapist, typically at least 4-6 weeks.   TED hose   Complete by:  As directed    Use stockings (TED hose) for 2 weeks on both leg(s).  You may remove them at night for sleeping.      Allergies as of 02/14/2018   No Known Allergies     Medication List    TAKE these medications   aspirin 81 MG chewable tablet Commonly known as:  ASPIRIN CHILDRENS Chew 1 tablet (81 mg total) by mouth 2 (two) times daily. Take for 4 weeks, then resume regular dose.   CALCIUM 500+D3 PO Take 1 tablet by mouth 2 (two) times daily.   cholecalciferol 1000 units tablet Commonly known as:  VITAMIN D Take 1,000 Units by mouth daily.   denosumab 60 MG/ML Sosy injection Commonly known as:  PROLIA Inject 60 mg into the skin every 6 (six) months.   docusate sodium 100 MG capsule Commonly known as:  COLACE Take 1 capsule (100 mg total) by mouth 2 (two) times daily.   DULoxetine 30 MG capsule Commonly known as:  CYMBALTA Take 1 capsule by mouth daily.   ferrous sulfate 325 (65 FE) MG tablet Commonly known as:  FERROUSUL Take 1 tablet (325 mg total) by mouth 3 (three) times daily with meals.   HYDROcodone-acetaminophen 7.5-325 MG tablet Commonly known as:   NORCO Take 1-2 tablets by mouth every 4 (four) hours as needed for moderate pain.   letrozole 2.5 MG tablet Commonly known as:  FEMARA Take 1 tablet by mouth daily.   methocarbamol 500 MG tablet Commonly known as:  ROBAXIN Take 1 tablet (500 mg total) by mouth every 6 (six) hours as needed for muscle spasms.   multivitamin with minerals tablet Take 1 tablet by mouth daily.   polyethylene glycol packet Commonly known as:  MIRALAX / GLYCOLAX Take 17 g by mouth 2 (two) times daily.            Discharge Care Instructions  (From admission, onward)  Start     Ordered   02/14/18 0000  Change dressing    Comments:  Maintain surgical dressing until follow up in the clinic. If the edges start to pull up, may reinforce with tape. If the dressing is no longer working, may remove and cover with gauze and tape, but must keep the area dry and clean.  Call with any questions or concerns.   02/14/18 0857       Signed: [place] Pugh. Shahab Polhamus   PA-C  02/14/2018, 8:58 AM

## 2020-01-17 IMAGING — DX DG PORTABLE PELVIS
1 series · 1 of 1 positions shown · non-contrast
Comparison: Intraoperative images February 13, 2018

CLINICAL DATA: Status post total hip replacement

EXAM:
PORTABLE PELVIS 1-2 VIEWS

[pelvis ap]
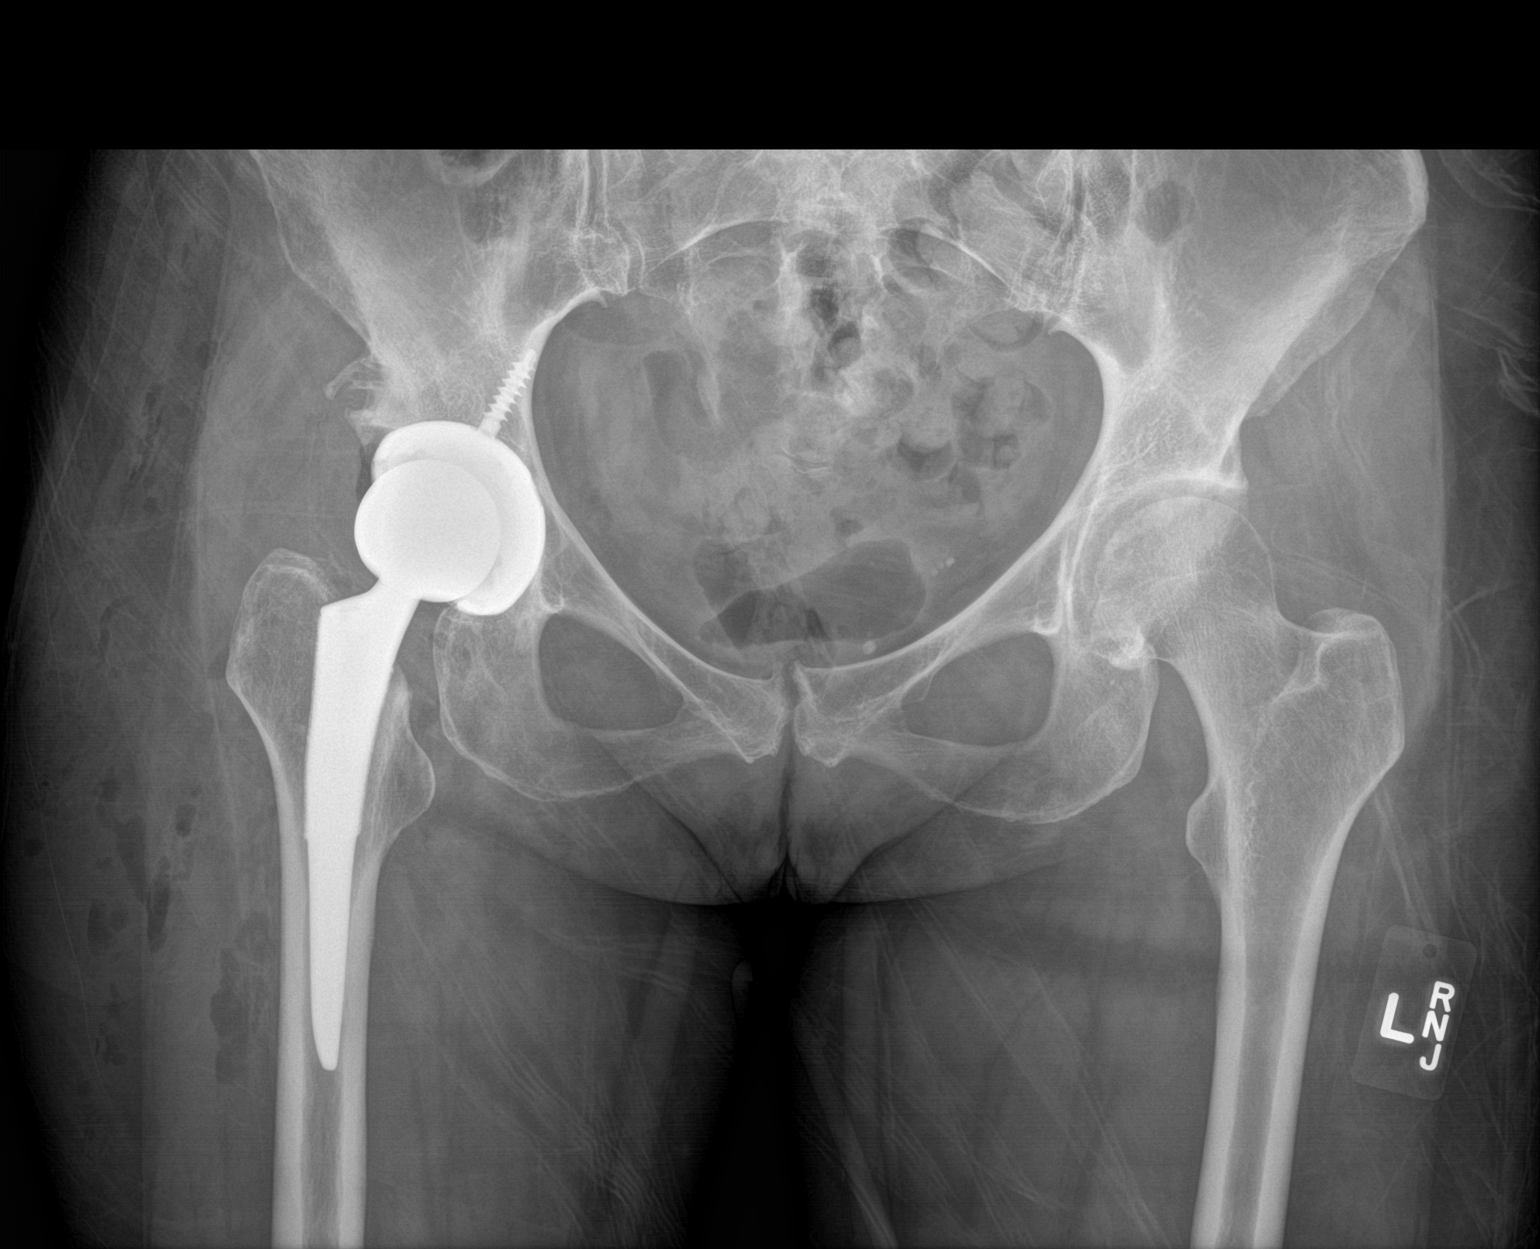

[1 of 1 positions shown; findings below may reference images not displayed]

FINDINGS: Frontal pelvis obtained. There is a total hip replacement right with
prosthetic components appearing well-seated on frontal view. No
acute fracture or dislocation evident. There is mild narrowing of
the left hip joint. There is bony overgrowth along the inferior,
lateral right iliac crest. Soft tissue air on the right is an
expected postoperative finding.
IMPRESSION: Total hip replacement right with prosthetic components well-seated
on frontal view. No acute fracture or dislocation. Soft tissue air
on the right is an expected postoperative finding. Bony overgrowth
along the inferolateral right iliac crest, likely of posttraumatic
etiology. Mild narrowing left hip joint.
